# Patient Record
Sex: Female | Born: 1975 | Race: Asian | Hispanic: No | Marital: Married | Smoking: Never smoker
Health system: Southern US, Community
[De-identification: ages and names within clinical notes are randomized; demographics above are authoritative.]

## PROBLEM LIST (undated history)

## (undated) DIAGNOSIS — I10 Essential (primary) hypertension: Secondary | ICD-10-CM

## (undated) DIAGNOSIS — R519 Headache, unspecified: Secondary | ICD-10-CM

## (undated) DIAGNOSIS — Z973 Presence of spectacles and contact lenses: Secondary | ICD-10-CM

---

## 2006-02-13 ENCOUNTER — Ambulatory Visit: Payer: Self-pay | Admitting: Obstetrics & Gynecology

## 2006-02-19 ENCOUNTER — Ambulatory Visit: Payer: Self-pay | Admitting: *Deleted

## 2006-02-26 ENCOUNTER — Ambulatory Visit (HOSPITAL_COMMUNITY): Admission: RE | Admit: 2006-02-26 | Discharge: 2006-02-26 | Payer: Self-pay | Admitting: *Deleted

## 2006-03-05 ENCOUNTER — Ambulatory Visit (HOSPITAL_COMMUNITY): Admission: RE | Admit: 2006-03-05 | Discharge: 2006-03-05 | Payer: Self-pay | Admitting: Obstetrics and Gynecology

## 2006-05-14 ENCOUNTER — Ambulatory Visit (HOSPITAL_COMMUNITY): Admission: RE | Admit: 2006-05-14 | Discharge: 2006-05-14 | Payer: Self-pay | Admitting: Obstetrics & Gynecology

## 2006-08-02 ENCOUNTER — Ambulatory Visit: Payer: Self-pay | Admitting: Gynecology

## 2006-08-02 ENCOUNTER — Inpatient Hospital Stay (HOSPITAL_COMMUNITY): Admission: AD | Admit: 2006-08-02 | Discharge: 2006-08-05 | Payer: Self-pay | Admitting: *Deleted

## 2006-08-07 ENCOUNTER — Ambulatory Visit: Payer: Self-pay | Admitting: Gynecology

## 2006-08-21 ENCOUNTER — Ambulatory Visit: Admission: RE | Admit: 2006-08-21 | Discharge: 2006-08-21 | Payer: Self-pay | Admitting: Obstetrics & Gynecology

## 2007-08-22 ENCOUNTER — Emergency Department (HOSPITAL_COMMUNITY): Admission: EM | Admit: 2007-08-22 | Discharge: 2007-08-23 | Payer: Self-pay | Admitting: Emergency Medicine

## 2011-02-23 NOTE — Op Note (Signed)
Amy Montgomery, Amy Montgomery             ACCOUNT NO.:  [id number]   MEDICAL RECORD NO.:  [id number]          PATIENT TYPE:  INP   LOCATION:  9142                          FACILITY:  [facility]   PHYSICIAN:  Ginger Carne, MD  DATE OF BIRTH:  Jan 02, 1976   DATE OF PROCEDURE:  08/03/2006  DATE OF DISCHARGE:                                 OPERATIVE REPORT   PREOPERATIVE DIAGNOSES:  1. Fetal bradycardia nonreassuring fetal heart rate.  2. Failure to progress first stage of labor.   POSTOP DIAGNOSES:  1. Fetal bradycardia nonreassuring fetal heart rate.  2. Failure to progress first stage of labor.  3. Term viable delivery of female infant.   PROCEDURE:  Primary low transverse cesarean section.   SURGEON:  Ginger Carne, MD   ASSISTANT:  Dr. Imogene Burn.   ESTIMATED BLOOD LOSS:  600 mL.   COMPLICATIONS:  None immediate.   ANESTHESIA:  Epidural.   OPERATIVE FINDINGS:  Term infant female delivered in a vertex presentation.  Apgar, weight, and time--per delivery room record.  No gross abnormalities.  Baby cried spontaneously at delivery.  Nuchal cord x1.  Amniotic fluid was  clear.  Uterus, tubes, and ovaries showed normal decidual changes of  pregnancy.  The placenta was complete, a 3-vessel cord with a central  insertion.   OPERATIVE PROCEDURE:  The patient prepped and draped in the usual fashion;  and placed in the left lateral supine position.  Betadine solution used for  antiseptic; and the patient was catheterized prior to the procedure.  After  adequate epidural analgesia, a Pfannenstiel incision was made; and the  abdomen opened.  Lower uterine segment incised transversely.  Baby  delivered, cord clamped and cut, and infant given to the pediatric staff  after bulb suctioning.  Placenta removed manually.  Uterus inspected.  Closure of uterine musculature in one layer of #0 Vicryl  running interlocking suture.  Bleeding points hemostatically checked.  Blood  clots removed.  Closure of  the fascia in one layer of #0 Vicryl suture; and  skin staples for the skin.  Instrument and sponge counts were correct.  The  patient tolerated the procedure well; and returned to the post anesthesia  recovery room in excellent condition.      Ginger Carne, MD  Electronically Signed     SHB/MEDQ  D:  08/03/2006  T:  08/04/2006  Job:  [id number]

## 2011-02-23 NOTE — Discharge Summary (Signed)
Amy Montgomery, Amy Montgomery             ACCOUNT NO.:  [id number]   MEDICAL RECORD NO.:  [id number]          PATIENT TYPE:  INP   LOCATION:  9142                          FACILITY:  [facility]   PHYSICIAN:  Lesly Dukes, M.D. DATE OF BIRTH:  [birth year]   DATE OF ADMISSION:  08/02/2006  DATE OF DISCHARGE:  08/05/2006                                 DISCHARGE SUMMARY   ADMISSION DIAGNOSES:  52. A 35 year old G1, P0 at 40 weeks.  2. Spontaneous rupture of membranes at 8:00 a.m. on October 26.   DISCHARGE DIAGNOSES:  1. Post partum day #3 from an low transverse cesarean section for      nonreassuring fetal heart tones.  2. Preeclampsia, treated with magnesium.  3. Live female child, delivered.  4. Spontaneous rupture of membranes.   HOSPITAL COURSE:  Patient was admitted, a 35 year old G1, P0 at 40 week.  She had spontaneous rupture of membranes on October 26 at about 8:00 a.m.  At this point, she was checked and found to be pooling positive, Nitrazine  positive, ferning positive, and her cervical exam was 470, -2.  It is  presumed that she is likely in active labor.  Patient did have some  increased blood pressures in triage, the highest being 155/112, but then  decreased to 132/98.  PIH labs and UA for protein were done.  UA showed 30  mg/dl protein as well as a high specific gravity of greater than 1.030.  Creatinine was 0.7.  ALP was 118, hemoglobin 13.4.  LDH was normal, but uric  acid was a little increased at 6.3.  The patient did not have any question  pain, headaches, or vision changes.  She was admitted to L&D for expectant  management.  She was started on Pitocin, low dose.  Due to her PIH labs, she  was placed on magnesium.  Her blood pressures were in the 120-130s during  her labor.  She was also given Stadol for pain p.r.n.  The patient was  settled for a C-section, primary LTCS, secondary to fetal bradycardia,  nonreassuring fetal heart tones, and failure to progress in the first  stage  of labor.  The patient delivered a female infant, Apgars 7 at one minute, 8 at  five minutes.  Please see delivery dictation for more information.  EBL was  600 cc.   The patient's post partum course was unremarkable.  She is Rubella nonimmune  and will be given a new Rubella vaccine prior to discharge.  She had a female  infant.  A circumcision was completed while in the hospital.  She is breast-  feeding.  She will be on Micronor for contraception.  She is to return to  the clinic and the hospital tomorrow or the next day for staple removal.  She was passing flatus and having only a mild amount of pain when she was  discharged.   The patient was continued on magnesium 24 hours after her delivery, but at  this time, her PIH labs were normalizing, and she did not have increased  blood pressure.   LABS:  Prenatal labs  include, patient was B+, antibody screen negative,  hemoglobin 13.8, platelets 273.  Rubella nonimmune.  Hepatitis B surface  antigen negative.  Syphilis nonreactive.  HIV nonreactive.  Gonorrhea and  Chlamydia negative.  GBS negative on July 08, 2006.  Primary GCP 112.   Patient's labs during hospitalization, her discharge CBC on October 27th is  white blood cells 18.1, hemoglobin 11.7, and hematocrit 33, platelets 184.  RPR was nonreactive.  Her last set of PIH labs, uric acid was 6, LDH 141.  CMP was within normal limits.   INSTRUCTIONS:  Patient is to be on pelvic rest x6 weeks.  She is to start  her Micronor about two weeks after she is discharged and is to take it at  the same time every day for birth control while she is breast-feeding.  She  is to call and make an appointment in six weeks for her normal post partum  appointment.  She is to come into the clinic tomorrow or the next day to get  her staples removed.  She is to call and make and appointment.  She was  given the number.   Discharge medications include ibuprofen, Percocet, Micronor, Colace,   prenatal vitamins.   DISCHARGE CONDITION:  Stable.     ______________________________  Alanda Amass, M.D.    ______________________________  Lesly Dukes, M.D.    JH/MEDQ  D:  08/05/2006  T:  08/05/2006  Job:  [id number]

## 2011-07-17 LAB — DIFFERENTIAL
Basophils Absolute: 0
Basophils Absolute: 0
Eosinophils Absolute: 0 — ABNORMAL LOW
Eosinophils Relative: 0
Lymphocytes Relative: 3 — ABNORMAL LOW
Monocytes Absolute: 0.4
Monocytes Absolute: 0.3
Neutro Abs: 12.4 — ABNORMAL HIGH

## 2011-07-17 LAB — URINALYSIS, ROUTINE W REFLEX MICROSCOPIC
Hgb urine dipstick: NEGATIVE
Ketones, ur: 15 — AB
Leukocytes, UA: NEGATIVE
Nitrite: NEGATIVE
Protein, ur: 30 — AB
Specific Gravity, Urine: 1.03
Urobilinogen, UA: 1

## 2011-07-17 LAB — CBC
HCT: 37.7
HCT: 36.8
Hemoglobin: 12.8
MCHC: 33.7
MCHC: 34.8
MCV: 86.8
Platelets: 248
RBC: 4.23
WBC: 13.4 — ABNORMAL HIGH

## 2011-07-17 LAB — I-STAT 8, (EC8 V) (CONVERTED LAB)
Operator id: 161631
Potassium: 3.2 — ABNORMAL LOW
pCO2, Ven: 29.4 — ABNORMAL LOW
pH, Ven: 7.494 — ABNORMAL HIGH

## 2011-07-17 LAB — URINE MICROSCOPIC-ADD ON

## 2011-07-17 LAB — POCT PREGNANCY, URINE
Operator id: 161631
Preg Test, Ur: NEGATIVE

## 2011-07-17 LAB — WET PREP, GENITAL
Trich, Wet Prep: NONE SEEN
Yeast Wet Prep HPF POC: NONE SEEN

## 2011-07-17 LAB — GC/CHLAMYDIA PROBE AMP, GENITAL
Chlamydia, DNA Probe: NEGATIVE
GC Probe Amp, Genital: NEGATIVE

## 2011-07-17 LAB — RPR: RPR Ser Ql: NONREACTIVE

## 2011-10-09 DIAGNOSIS — B977 Papillomavirus as the cause of diseases classified elsewhere: Secondary | ICD-10-CM

## 2011-10-09 HISTORY — DX: Papillomavirus as the cause of diseases classified elsewhere: B97.7

## 2011-12-26 ENCOUNTER — Ambulatory Visit
Admission: RE | Admit: 2011-12-26 | Discharge: 2011-12-26 | Disposition: A | Discharge status: Home / Self Care | Payer: 59 | Source: Ambulatory Visit | Attending: Family Medicine | Admitting: Family Medicine

## 2011-12-26 ENCOUNTER — Other Ambulatory Visit: Payer: Self-pay | Admitting: Family Medicine

## 2011-12-26 DIAGNOSIS — T1490XA Injury, unspecified, initial encounter: Secondary | ICD-10-CM

## 2012-01-24 ENCOUNTER — Other Ambulatory Visit (HOSPITAL_COMMUNITY)
Admission: RE | Admit: 2012-01-24 | Discharge: 2012-01-24 | Disposition: A | Discharge status: Home / Self Care | Payer: 59 | Source: Ambulatory Visit | Attending: Family Medicine | Admitting: Family Medicine

## 2012-01-24 ENCOUNTER — Other Ambulatory Visit: Payer: Self-pay | Admitting: Family Medicine

## 2012-01-24 DIAGNOSIS — R87810 Cervical high risk human papillomavirus (HPV) DNA test positive: Secondary | ICD-10-CM | POA: Insufficient documentation

## 2012-01-24 DIAGNOSIS — Z124 Encounter for screening for malignant neoplasm of cervix: Secondary | ICD-10-CM | POA: Insufficient documentation

## 2012-02-27 ENCOUNTER — Other Ambulatory Visit: Payer: Self-pay | Admitting: Obstetrics and Gynecology

## 2012-09-03 ENCOUNTER — Other Ambulatory Visit: Payer: Self-pay | Admitting: Family Medicine

## 2012-09-03 ENCOUNTER — Other Ambulatory Visit (HOSPITAL_COMMUNITY)
Admission: RE | Admit: 2012-09-03 | Discharge: 2012-09-03 | Disposition: A | Discharge status: Home / Self Care | Payer: 59 | Source: Ambulatory Visit | Attending: Family Medicine | Admitting: Family Medicine

## 2012-09-03 DIAGNOSIS — Z01419 Encounter for gynecological examination (general) (routine) without abnormal findings: Secondary | ICD-10-CM | POA: Insufficient documentation

## 2012-09-03 DIAGNOSIS — R87810 Cervical high risk human papillomavirus (HPV) DNA test positive: Secondary | ICD-10-CM | POA: Insufficient documentation

## 2012-09-03 DIAGNOSIS — Z1151 Encounter for screening for human papillomavirus (HPV): Secondary | ICD-10-CM | POA: Insufficient documentation

## 2013-03-06 ENCOUNTER — Other Ambulatory Visit (HOSPITAL_COMMUNITY)
Admission: RE | Admit: 2013-03-06 | Discharge: 2013-03-06 | Disposition: A | Discharge status: Home / Self Care | Payer: 59 | Source: Ambulatory Visit | Attending: Obstetrics and Gynecology | Admitting: Obstetrics and Gynecology

## 2013-03-06 ENCOUNTER — Other Ambulatory Visit: Payer: Self-pay | Admitting: Obstetrics and Gynecology

## 2013-03-06 DIAGNOSIS — R87810 Cervical high risk human papillomavirus (HPV) DNA test positive: Secondary | ICD-10-CM | POA: Insufficient documentation

## 2013-03-06 DIAGNOSIS — Z1151 Encounter for screening for human papillomavirus (HPV): Secondary | ICD-10-CM | POA: Insufficient documentation

## 2013-03-06 DIAGNOSIS — Z01419 Encounter for gynecological examination (general) (routine) without abnormal findings: Secondary | ICD-10-CM | POA: Insufficient documentation

## 2013-04-08 ENCOUNTER — Other Ambulatory Visit: Payer: Self-pay | Admitting: Obstetrics and Gynecology

## 2019-10-09 DIAGNOSIS — U071 COVID-19: Secondary | ICD-10-CM

## 2019-10-09 HISTORY — DX: COVID-19: U07.1

## 2021-10-04 ENCOUNTER — Other Ambulatory Visit: Payer: Self-pay | Admitting: Internal Medicine

## 2021-10-04 DIAGNOSIS — Z1231 Encounter for screening mammogram for malignant neoplasm of breast: Secondary | ICD-10-CM

## 2021-10-08 DIAGNOSIS — D069 Carcinoma in situ of cervix, unspecified: Secondary | ICD-10-CM

## 2021-10-08 HISTORY — DX: Carcinoma in situ of cervix, unspecified: D06.9

## 2021-10-19 ENCOUNTER — Other Ambulatory Visit: Payer: Self-pay | Admitting: Obstetrics and Gynecology

## 2021-11-02 ENCOUNTER — Ambulatory Visit: Payer: Self-pay

## 2021-11-08 ENCOUNTER — Ambulatory Visit
Admission: RE | Admit: 2021-11-08 | Discharge: 2021-11-08 | Disposition: A | Discharge status: Home / Self Care | Payer: 59 | Source: Ambulatory Visit | Attending: Internal Medicine | Admitting: Internal Medicine

## 2021-11-08 DIAGNOSIS — Z1231 Encounter for screening mammogram for malignant neoplasm of breast: Secondary | ICD-10-CM

## 2022-01-19 HISTORY — PX: COLONOSCOPY: SHX174

## 2022-05-15 ENCOUNTER — Other Ambulatory Visit: Payer: Self-pay

## 2022-05-15 ENCOUNTER — Encounter (HOSPITAL_BASED_OUTPATIENT_CLINIC_OR_DEPARTMENT_OTHER): Payer: Self-pay | Admitting: Obstetrics and Gynecology

## 2022-05-15 NOTE — Progress Notes (Addendum)
Spoke w/ via phone for pre-op interview---Amy Montgomery needs dos---- ISTAT, EKG, urine pregnancy per anesthesia, surgeon orders pending as of 05/15/22              Montgomery results------none COVID test -----patient states asymptomatic no test needed Arrive at -------0530 on Tuesday, 05/22/22 NPO after MN NO Solid Food.  Clear liquids from MN until---0430 Med rec completed Medications to take morning of surgery -----Amlodipine Diabetic medication -----n/a Patient instructed no nail polish to be worn day of surgery Patient instructed to bring photo id and insurance card day of surgery Patient aware to have Driver (ride ) / caregiver    for 24 hours after surgery - husband, Amy Montgomery Patient Special Instructions -----Patient is going to wear glasses instead on contacts on day of surgery. Pre-Op special Istructions -----Requested orders from Dr. Richardson Dopp on 05/15/22 via Epic IB. Patient verbalized understanding of instructions that were given at this phone interview. Patient denies shortness of breath, chest pain, fever, cough at this phone interview.

## 2022-05-21 ENCOUNTER — Other Ambulatory Visit: Payer: Self-pay | Admitting: Obstetrics and Gynecology

## 2022-05-21 DIAGNOSIS — D069 Carcinoma in situ of cervix, unspecified: Secondary | ICD-10-CM

## 2022-05-21 NOTE — Anesthesia Preprocedure Evaluation (Signed)
Anesthesia Evaluation  Patient identified by MRN, date of birth, ID band Patient awake    Reviewed: Allergy & Precautions, NPO status , Patient's Chart, lab work & pertinent test results  Airway Mallampati: II  TM Distance: >3 FB Neck ROM: Full    Dental no notable dental hx. (+) Teeth Intact, Dental Advisory Given   Pulmonary    Pulmonary exam normal breath sounds clear to auscultation       Cardiovascular hypertension, Pt. on medications Normal cardiovascular exam Rhythm:Regular Rate:Normal     Neuro/Psych  Headaches,    GI/Hepatic negative GI ROS, Neg liver ROS,   Endo/Other  negative endocrine ROS  Renal/GU negative Renal ROS     Musculoskeletal negative musculoskeletal ROS (+)   Abdominal (+) + obese (BMI 39.3),   Peds  Hematology Lab Results           HGB                      15.3 (H)            05/22/2022                HCT                      45.0                05/22/2022                   Anesthesia Other Findings NKA  Reproductive/Obstetrics                           Anesthesia Physical Anesthesia Plan  ASA: 3  Anesthesia Plan: General   Post-op Pain Management: Toradol IV (intra-op)*, Dilaudid IV and Tylenol PO (pre-op)*   Induction: Intravenous  PONV Risk Score and Plan: 4 or greater and Treatment may vary due to age or medical condition, Ondansetron, Midazolam and Dexamethasone  Airway Management Planned: LMA  Additional Equipment: None  Intra-op Plan:   Post-operative Plan:   Informed Consent: I have reviewed the patients History and Physical, chart, labs and discussed the procedure including the risks, benefits and alternatives for the proposed anesthesia with the patient or authorized representative who has indicated his/her understanding and acceptance.     Dental advisory given  Plan Discussed with:   Anesthesia Plan Comments:        Anesthesia  Quick Evaluation

## 2022-05-21 NOTE — H&P (Deleted)
  The note originally documented on this encounter has been moved the the encounter in which it belongs.  

## 2022-05-21 NOTE — H&P (Signed)
Reason for Appointment   1. PreOp for 05/22/22       History of Present Illness  General:          46 y/o presents for preop visit. Pt is schedule for a Cold knife conization on 05/22/2022 for the management of CIN III.        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        Pap smear 09/28/2021 was LGSIL with high risk hpv detected.     Current Medications  Taking  Advil(Ibuprofen) 200 MG Tablet 1 tablet Orally every 6 hrs    amLODIPine Besylate 10 MG Tablet TAKE ONE TABLET BY MOUTH DAILY     Multivitamin     Medication List reviewed and reconciled with the patient         Past Medical History        Hypertension.         Obesity.         Hypercholesterolemia.        Surgical History         Csection 2007         colposcopy 2007       Family History   Father: alive, sleep apnea, high cholesterol, diagnosed with Hypertension, Coronary artery disease   Mother: deceased, Hypercholesterolemia, diagnosed with Hypertension, CVA   Paternal Grand Father: deceased, stroke, heart attack   Maternal Grand Mother: deceased, diagnosed with Diabetes, Breast cancer   Brother 1: deceased, diagnosed with Hypertension, Coronary artery disease   Brother2: alive   Maternal aunt: Breast CA   2 brother(s) .    denies any GYN family cancer hx.       Social History  General:   Tobacco use  cigarettes: Never smoked, Tobacco history last updated 05/01/2022. no EXPOSURE TO PASSIVE SMOKE. Alcohol: yes, Rare. Caffeine: yes, occasionally, coffee, tea. no Recreational drug use. DIET: regular. Exercise: yes, dancer. Marital Status: married. Children: 1 boy. OCCUPATION: employed, [employer] - costuming.  first cousin diagnosed with stage 4 colon cancer at the age of 37.     Gyn History  Sexual activity not currently sexually active.   Periods : regular.   LMP 04/14/2022.   Denies H/O Birth  control.   Last pap smear date 09/28/2021- LSIL/HPV+.   Denies H/O Last mammogram date.   Abnormal pap smear HPV, , assessed with colposcopy 02/27/12, 03/06/13 CIN-1 HPV,, LGSIL, 09/28/2021- LSIL/HPV+ assessed with colposcopy 10/19/2021.   STD HPV.   Menarche 15.   GYN procedures Colposcopy [facility] in [place].       OB History  Number of pregnancies 1.   Pregnancy # 1 07/2006 , live birth, C-section delivery.       Allergies   N.K.D.A.       Hospitalization/Major Diagnostic Procedure   child birth        Review of Systems  CONSTITUTIONAL:         Chills No. Fatigue No. Fever No. Night sweats No. Recent travel outside [place] No. Sweats No. Weight change No.     OPHTHALMOLOGY:         Blurring of vision no. Change in vision no. Double vision no.     ENT:         Dizziness no. Nose bleeds no. Sore throat no. Teeth pain no.     ALLERGY:         Hives no.  CARDIOLOGY:         Chest pain no. High blood pressure no. Irregular heart beat no. Leg edema no. Palpitations no.     RESPIRATORY:         Shortness of breath no. Cough no. Wheezing no.     UROLOGY:         Pain with urination no. Urinary urgency no. Urinary frequency no. Urinary incontinence no. Difficulty urinating No. Blood in urine No.     GASTROENTEROLOGY:         Abdominal pain no. Appetite change no. Bloating/belching no. Blood in stool or on toilet paper no. Change in bowel movements no. Constipation no. Diarrhea no. Difficulty swallowing no. Nausea no.     FEMALE REPRODUCTIVE:         Vulvar pain no. Vulvar rash no. Abnormal vaginal bleeding no. Breast pain no. Nipple discharge no. Pain with intercourse no. Pelvic pain no. Unusual vaginal discharge no. Vaginal itching no.     MUSCULOSKELETAL:         Muscle aches no.     NEUROLOGY:         Headache no. Tingling/numbness no. Weakness no.     PSYCHOLOGY:         Depression no. Anxiety no. Nervousness no. Sleep disturbances no. Suicidal ideation no .      ENDOCRINOLOGY:         Excessive thirst no. Excessive urination no. Hair loss no. Heat or cold intolerance no.     HEMATOLOGY/LYMPH:         Abnormal bleeding no. Easy bruising no. Swollen glands no.     DERMATOLOGY:         New/changing skin lesion no. Rash no. Sores no.          Vital Signs  Wt 222.4, Wt change 11.8 lbs, Ht 63, BMI 39.39, Pulse sitting 78, BP sitting 137/89.     Examination  General Examination:       CONSTITUTIONAL: alert, oriented, NAD . SKIN:  moist, warm. EYES:  Conjunctiva clear. LUNGS:  good I:E efffort noted, clear to auscultation bilaterally. HEART:  regular rate and rhythm. ABDOMEN: soft, non-tender/non-distended, bowel sounds present . FEMALE GENITOURINARY: normal external genitalia, labia - unremarkable, vagina - pink moist mucosa, no lesions or abnormal discharge, cervix - no discharge or lesions or CMT, adnexa - no masses or tenderness, uterus - nontender and normal size on palpation . PSYCH:  affect normal, good eye contact.       Physical Examination  Chaperone present:         Chaperone present  Lucie Leather 05/01/2022 04:50:48 PM > , for pelvic exam.            Assessments     1. CIN III (cervical intraepithelial neoplasia grade III) with severe dysplasia - D06.9 (Primary)     Treatment   1. CIN III (cervical intraepithelial neoplasia grade III) with severe dysplasia   Notes: recommend cold knife conization. r/b/a of surgery were discussed including but not limited to infection bleeding damage to surrounding organs with the need for further surgery. pt voiced understanding and desires to proceed. she is advised to avoid eating or drinking after midnight prior to surgery. She is also advised to avoid taking NSAIDs starting 14 days prior to surgery.

## 2022-05-22 ENCOUNTER — Ambulatory Visit (HOSPITAL_BASED_OUTPATIENT_CLINIC_OR_DEPARTMENT_OTHER): Payer: 59 | Admitting: Anesthesiology

## 2022-05-22 ENCOUNTER — Other Ambulatory Visit: Payer: Self-pay

## 2022-05-22 ENCOUNTER — Encounter (HOSPITAL_BASED_OUTPATIENT_CLINIC_OR_DEPARTMENT_OTHER): Payer: Self-pay | Admitting: Obstetrics and Gynecology

## 2022-05-22 ENCOUNTER — Ambulatory Visit (HOSPITAL_BASED_OUTPATIENT_CLINIC_OR_DEPARTMENT_OTHER)
Admission: RE | Admit: 2022-05-22 | Discharge: 2022-05-22 | Disposition: A | Discharge status: Home / Self Care | Payer: 59 | Source: Ambulatory Visit | Attending: Obstetrics and Gynecology | Admitting: Obstetrics and Gynecology

## 2022-05-22 ENCOUNTER — Encounter (HOSPITAL_BASED_OUTPATIENT_CLINIC_OR_DEPARTMENT_OTHER)
Admission: RE | Disposition: A | Discharge status: Home / Self Care | Payer: Self-pay | Source: Ambulatory Visit | Attending: Obstetrics and Gynecology

## 2022-05-22 DIAGNOSIS — Z6838 Body mass index (BMI) 38.0-38.9, adult: Secondary | ICD-10-CM

## 2022-05-22 DIAGNOSIS — E669 Obesity, unspecified: Secondary | ICD-10-CM

## 2022-05-22 DIAGNOSIS — I10 Essential (primary) hypertension: Secondary | ICD-10-CM | POA: Insufficient documentation

## 2022-05-22 DIAGNOSIS — Z6839 Body mass index (BMI) 39.0-39.9, adult: Secondary | ICD-10-CM | POA: Insufficient documentation

## 2022-05-22 DIAGNOSIS — D069 Carcinoma in situ of cervix, unspecified: Secondary | ICD-10-CM | POA: Diagnosis not present

## 2022-05-22 DIAGNOSIS — Z01818 Encounter for other preprocedural examination: Secondary | ICD-10-CM

## 2022-05-22 HISTORY — DX: Headache, unspecified: R51.9

## 2022-05-22 HISTORY — DX: Presence of spectacles and contact lenses: Z97.3

## 2022-05-22 HISTORY — PX: CERVICAL CONIZATION W/BX: SHX1330

## 2022-05-22 HISTORY — DX: Essential (primary) hypertension: I10

## 2022-05-22 LAB — POCT I-STAT, CHEM 8
BUN: 11 mg/dL (ref 6–20)
Calcium, Ion: 1.13 mmol/L — ABNORMAL LOW (ref 1.15–1.40)
Chloride: 102 mmol/L (ref 98–111)
Creatinine, Ser: 0.7 mg/dL (ref 0.44–1.00)
Glucose, Bld: 102 mg/dL — ABNORMAL HIGH (ref 70–99)
HCT: 45 % (ref 36.0–46.0)
Hemoglobin: 15.3 g/dL — ABNORMAL HIGH (ref 12.0–15.0)
Potassium: 3.3 mmol/L — ABNORMAL LOW (ref 3.5–5.1)
Sodium: 140 mmol/L (ref 135–145)
TCO2: 24 mmol/L (ref 22–32)

## 2022-05-22 LAB — POCT PREGNANCY, URINE: Preg Test, Ur: NEGATIVE

## 2022-05-22 SURGERY — CONE BIOPSY, CERVIX
Anesthesia: General | Site: Cervix

## 2022-05-22 MED ORDER — DEXAMETHASONE SODIUM PHOSPHATE 10 MG/ML IJ SOLN
INTRAMUSCULAR | Status: AC
  Filled 2022-05-22: qty 1

## 2022-05-22 MED ORDER — KETOROLAC TROMETHAMINE 30 MG/ML IJ SOLN: 30.0000 mg | Freq: Once | INTRAMUSCULAR | Status: DC | PRN

## 2022-05-22 MED ORDER — LIDOCAINE-EPINEPHRINE 1 %-1:100000 IJ SOLN
INTRAMUSCULAR | Status: DC | PRN
  Administered 2022-05-22: 20 mL

## 2022-05-22 MED ORDER — KETOROLAC TROMETHAMINE 30 MG/ML IJ SOLN
INTRAMUSCULAR | Status: AC
  Filled 2022-05-22: qty 1

## 2022-05-22 MED ORDER — IBUPROFEN 800 MG PO TABS: 800.0000 mg | ORAL_TABLET | Freq: Three times a day (TID) | ORAL | 0 refills | Status: AC | PRN

## 2022-05-22 MED ORDER — MIDAZOLAM HCL 2 MG/2ML IJ SOLN
INTRAMUSCULAR | Status: AC
  Filled 2022-05-22: qty 2

## 2022-05-22 MED ORDER — ACETAMINOPHEN 500 MG PO TABS
1000.0000 mg | ORAL_TABLET | ORAL | Status: AC
  Administered 2022-05-22: 1000 mg via ORAL

## 2022-05-22 MED ORDER — PROPOFOL 500 MG/50ML IV EMUL
INTRAVENOUS | Status: AC
  Filled 2022-05-22: qty 50

## 2022-05-22 MED ORDER — LACTATED RINGERS IV SOLN: INTRAVENOUS | Status: DC

## 2022-05-22 MED ORDER — DEXAMETHASONE SODIUM PHOSPHATE 10 MG/ML IJ SOLN
INTRAMUSCULAR | Status: DC | PRN
  Administered 2022-05-22: 10 mg via INTRAVENOUS

## 2022-05-22 MED ORDER — ACETAMINOPHEN 500 MG PO TABS
ORAL_TABLET | ORAL | Status: AC
  Filled 2022-05-22: qty 2

## 2022-05-22 MED ORDER — OXYCODONE HCL 5 MG/5ML PO SOLN: 5.0000 mg | Freq: Once | ORAL | Status: DC | PRN

## 2022-05-22 MED ORDER — 0.9 % SODIUM CHLORIDE (POUR BTL) OPTIME
TOPICAL | Status: DC | PRN
  Administered 2022-05-22: 500 mL

## 2022-05-22 MED ORDER — GELATIN ABSORBABLE 12-7 MM EX MISC: CUTANEOUS | Status: DC | PRN

## 2022-05-22 MED ORDER — FENTANYL CITRATE (PF) 100 MCG/2ML IJ SOLN
INTRAMUSCULAR | Status: AC
  Filled 2022-05-22: qty 2

## 2022-05-22 MED ORDER — HYDROMORPHONE HCL 1 MG/ML IJ SOLN: 0.2500 mg | INTRAMUSCULAR | Status: DC | PRN

## 2022-05-22 MED ORDER — POVIDONE-IODINE 10 % EX SWAB: 2.0000 | Freq: Once | CUTANEOUS | Status: DC

## 2022-05-22 MED ORDER — ONDANSETRON HCL 4 MG/2ML IJ SOLN
INTRAMUSCULAR | Status: AC
  Filled 2022-05-22: qty 2

## 2022-05-22 MED ORDER — OXYCODONE HCL 5 MG PO TABS: 5.0000 mg | ORAL_TABLET | Freq: Once | ORAL | Status: DC | PRN

## 2022-05-22 MED ORDER — OXYCODONE HCL 5 MG PO TABS: 5.0000 mg | ORAL_TABLET | ORAL | 0 refills | Status: AC | PRN

## 2022-05-22 MED ORDER — PROPOFOL 10 MG/ML IV BOLUS
INTRAVENOUS | Status: DC | PRN
  Administered 2022-05-22: 200 mg via INTRAVENOUS

## 2022-05-22 MED ORDER — LIDOCAINE 2% (20 MG/ML) 5 ML SYRINGE
INTRAMUSCULAR | Status: DC | PRN
  Administered 2022-05-22: 100 mg via INTRAVENOUS

## 2022-05-22 MED ORDER — HEMOSTATIC AGENTS (NO CHARGE) OPTIME
TOPICAL | Status: DC | PRN
  Administered 2022-05-22: 1 via TOPICAL

## 2022-05-22 MED ORDER — FENTANYL CITRATE (PF) 100 MCG/2ML IJ SOLN
INTRAMUSCULAR | Status: DC | PRN
  Administered 2022-05-22: 50 ug via INTRAVENOUS

## 2022-05-22 MED ORDER — ONDANSETRON HCL 4 MG/2ML IJ SOLN
INTRAMUSCULAR | Status: DC | PRN
  Administered 2022-05-22: 4 mg via INTRAVENOUS

## 2022-05-22 MED ORDER — FERRIC SUBSULFATE (BULK) SOLN
Status: DC | PRN
  Administered 2022-05-22: 1

## 2022-05-22 MED ORDER — MIDAZOLAM HCL 5 MG/5ML IJ SOLN
INTRAMUSCULAR | Status: DC | PRN
  Administered 2022-05-22: 2 mg via INTRAVENOUS

## 2022-05-22 MED ORDER — ONDANSETRON HCL 4 MG/2ML IJ SOLN: 4.0000 mg | Freq: Once | INTRAMUSCULAR | Status: DC | PRN

## 2022-05-22 MED ORDER — IODINE STRONG (LUGOLS) 5 % PO SOLN
ORAL | Status: DC | PRN
  Administered 2022-05-22: 0.1 mL

## 2022-05-22 SURGICAL SUPPLY — 29 items
BLADE SURG 11 STRL SS (BLADE) ×2 IMPLANT
CATH ROBINSON RED A/P 16FR (CATHETERS) ×2 IMPLANT
DRSG TELFA 3X8 NADH (GAUZE/BANDAGES/DRESSINGS) ×2 IMPLANT
ELECT BALL LEEP 5MM RED (ELECTRODE) ×3 IMPLANT
ELECT REM PT RETURN 9FT ADLT (ELECTROSURGICAL) ×2
ELECTRODE REM PT RTRN 9FT ADLT (ELECTROSURGICAL) ×1 IMPLANT
GAUZE 4X4 16PLY ~~LOC~~+RFID DBL (SPONGE) ×6 IMPLANT
GLOVE BIOGEL M 6.5 STRL (GLOVE) ×2 IMPLANT
GLOVE BIOGEL PI IND STRL 6.5 (GLOVE) ×2 IMPLANT
GLOVE BIOGEL PI IND STRL 7.0 (GLOVE) ×1 IMPLANT
GLOVE BIOGEL PI INDICATOR 6.5 (GLOVE) ×2
GLOVE BIOGEL PI INDICATOR 7.0 (GLOVE) ×1
GOWN STRL REUS W/TWL LRG LVL3 (GOWN DISPOSABLE) ×4 IMPLANT
HEMOSTAT SURGICEL 4X8 (HEMOSTASIS) ×1 IMPLANT
KIT TURNOVER CYSTO (KITS) ×2 IMPLANT
NS IRRIG 1000ML POUR BTL (IV SOLUTION) ×2 IMPLANT
PACK VAGINAL MINOR WOMEN LF (CUSTOM PROCEDURE TRAY) ×2 IMPLANT
PACK VAGINAL WOMENS (CUSTOM PROCEDURE TRAY) ×2 IMPLANT
PAD DRESSING TELFA 3X8 NADH (GAUZE/BANDAGES/DRESSINGS) ×1 IMPLANT
PAD OB MATERNITY 4.3X12.25 (PERSONAL CARE ITEMS) ×2 IMPLANT
PENCIL SMOKE EVACUATOR (MISCELLANEOUS) IMPLANT
SCOPETTES 8  STERILE (MISCELLANEOUS) ×2
SCOPETTES 8 STERILE (MISCELLANEOUS) ×1 IMPLANT
SLEEVE SCD COMPRESS KNEE MED (STOCKING) ×2 IMPLANT
SPONGE SURGIFOAM ABS GEL 12-7 (HEMOSTASIS) ×1 IMPLANT
SUT VIC AB 0 CT1 27 (SUTURE) ×10
SUT VIC AB 0 CT1 27XBRD ANBCTR (SUTURE) ×3 IMPLANT
TOWEL OR 17X26 10 PK STRL BLUE (TOWEL DISPOSABLE) ×4 IMPLANT
TUBE CONNECTING 12X1/4 (SUCTIONS) ×4 IMPLANT

## 2022-05-22 NOTE — Transfer of Care (Signed)
Immediate Anesthesia Transfer of Care Note  Patient: Amy Montgomery  Procedure(s) Performed: CONIZATION CERVIX WITH BIOPSY (Cervix)  Patient Location: PACU  Anesthesia Type:General  Level of Consciousness: drowsy  Airway & Oxygen Therapy: Patient Spontanous Breathing and Patient connected to nasal cannula oxygen  Post-op Assessment: Report given to RN  Post vital signs: Reviewed and stable  Last Vitals:  Vitals Value Taken Time  BP 105/67   Temp    Pulse 78 05/22/22 0826  Resp    SpO2 97 % 05/22/22 0826  Vitals shown include unvalidated device data.  Last Pain:  Vitals:   05/22/22 0656  TempSrc: Oral         Complications: No notable events documented.

## 2022-05-22 NOTE — Discharge Instructions (Signed)

## 2022-05-22 NOTE — Anesthesia Postprocedure Evaluation (Signed)
Anesthesia Post Note  Patient: Velna Hatchet  Procedure(s) Performed: CONIZATION CERVIX WITH BIOPSY (Cervix)     Patient location during evaluation: PACU Anesthesia Type: General Level of consciousness: awake and alert Pain management: pain level controlled Vital Signs Assessment: post-procedure vital signs reviewed and stable Respiratory status: spontaneous breathing, nonlabored ventilation, respiratory function stable and patient connected to nasal cannula oxygen Cardiovascular status: blood pressure returned to baseline and stable Postop Assessment: no apparent nausea or vomiting Anesthetic complications: no   No notable events documented.  Last Vitals:  Vitals:   05/22/22 0915 05/22/22 0950  BP: 118/80 131/87  Pulse: 71 70  Resp: 19 19  Temp:    SpO2: 98% 99%    Last Pain:  Vitals:   05/22/22 0950  TempSrc:   PainSc: 0-No pain                 Trevor Iha

## 2022-05-22 NOTE — Op Note (Signed)
05/22/2022  8:24 AM  PATIENT:  Amy Montgomery  46 y.o. female  PRE-OPERATIVE DIAGNOSIS:  D06.9 CIN III  POST-OPERATIVE DIAGNOSIS:  D06.9 CIN III  PROCEDURE:  Procedure(s): CONIZATION CERVIX WITH BIOPSY (N/A)  SURGEON:  Surgeon(s) and Role:    Gerald Leitz, MD - Primary  PHYSICIAN ASSISTANT:   ASSISTANTS: none   ANESTHESIA:   general  EBL:  10 mL   BLOOD ADMINISTERED:none  DRAINS: none   LOCAL MEDICATIONS USED:  LIDOCAINE   SPECIMEN:  Source of Specimen:  cervical cone  DISPOSITION OF SPECIMEN:  PATHOLOGY  COUNTS:  YES  TOURNIQUET:  * No tourniquets in log *  DICTATION: .Note written in EPIC  PLAN OF CARE: Discharge to home after PACU  PATIENT DISPOSITION:  PACU - hemodynamically stable.   Delay start of Pharmacological VTE agent (>24hrs) due to surgical blood loss or risk of bleeding: not applicable  Findings: normal appearing external genitalia and vaginal mucosa.. The cervix displayed acetowhite area at 12 and 6 oclock with application of lugols.   Procedure. Pt was taken to the operating room where she was placed under general anesthesia. Time out was performed. She was prepped and draped in a sterile fashion. Speculum was placed. The anterior lip of the cervix was grasped with single toothed tenaculum.  10 cc of 1% lidocaine with epi was placed at the 4 and 8 o'clock positions. A suture of 0 vicryl was placed at the 3 and 9 o'clock positions and used for retraction. The single toothed tenaculum was removed. Lugol's  Was placed on the cervix with the findings noted above. Scalpel was used to excise a cone portion of the cervix. The specimen was marked with a suture at the 12 o'clock position and sent to pathology. Hemostasis was obtained with roller ball and Monsel. surgicel was placed in the wound bed.  Excellent hemostasis was noted.  Sponge lap and needle counts were correct x 2. Pt was awakened from anesthesia and taken to the recovery room in stable condition.

## 2022-05-22 NOTE — H&P (Signed)
Date of Initial H&P: 05/22/2022  History reviewed, patient examined, no change in status, stable for surgery.

## 2022-05-22 NOTE — Anesthesia Procedure Notes (Signed)
Procedure Name: LMA Insertion Date/Time: 05/22/2022 7:40 AM  Performed by: Briant Sites, CRNAPre-anesthesia Checklist: Patient identified, Emergency Drugs available, Suction available and Patient being monitored Patient Re-evaluated:Patient Re-evaluated prior to induction Oxygen Delivery Method: Circle system utilized Preoxygenation: Pre-oxygenation with 100% oxygen Induction Type: IV induction Ventilation: Mask ventilation without difficulty LMA: LMA inserted LMA Size: 4.0 Number of attempts: 1 Airway Equipment and Method: Bite block Placement Confirmation: positive ETCO2 Dental Injury: Teeth and Oropharynx as per pre-operative assessment

## 2022-05-23 ENCOUNTER — Encounter (HOSPITAL_BASED_OUTPATIENT_CLINIC_OR_DEPARTMENT_OTHER): Payer: Self-pay | Admitting: Obstetrics and Gynecology

## 2022-05-23 LAB — SURGICAL PATHOLOGY

## 2022-10-29 ENCOUNTER — Other Ambulatory Visit (HOSPITAL_COMMUNITY)
Admission: RE | Admit: 2022-10-29 | Discharge: 2022-10-29 | Disposition: A | Discharge status: Home / Self Care | Payer: 59 | Source: Ambulatory Visit | Attending: Obstetrics and Gynecology | Admitting: Obstetrics and Gynecology

## 2022-10-29 DIAGNOSIS — D069 Carcinoma in situ of cervix, unspecified: Secondary | ICD-10-CM | POA: Insufficient documentation

## 2022-10-29 DIAGNOSIS — Z01419 Encounter for gynecological examination (general) (routine) without abnormal findings: Secondary | ICD-10-CM | POA: Insufficient documentation

## 2022-11-01 LAB — CYTOLOGY - PAP
Comment: NEGATIVE
Diagnosis: NEGATIVE
High risk HPV: NEGATIVE

## 2023-03-27 ENCOUNTER — Ambulatory Visit
Admission: RE | Admit: 2023-03-27 | Discharge: 2023-03-27 | Disposition: A | Discharge status: Home / Self Care | Payer: 59 | Source: Ambulatory Visit | Attending: Internal Medicine | Admitting: Internal Medicine

## 2023-03-27 ENCOUNTER — Other Ambulatory Visit: Payer: Self-pay | Admitting: Internal Medicine

## 2023-03-27 DIAGNOSIS — Z0000 Encounter for general adult medical examination without abnormal findings: Secondary | ICD-10-CM

## 2023-04-30 ENCOUNTER — Other Ambulatory Visit: Payer: Self-pay | Admitting: Obstetrics and Gynecology

## 2023-04-30 ENCOUNTER — Other Ambulatory Visit (HOSPITAL_COMMUNITY)
Admission: RE | Admit: 2023-04-30 | Discharge: 2023-04-30 | Disposition: A | Discharge status: Home / Self Care | Payer: 59 | Source: Ambulatory Visit | Attending: Obstetrics and Gynecology | Admitting: Obstetrics and Gynecology

## 2023-04-30 DIAGNOSIS — D069 Carcinoma in situ of cervix, unspecified: Secondary | ICD-10-CM | POA: Diagnosis not present

## 2023-05-03 LAB — CYTOLOGY - PAP
Adequacy: ABSENT
Comment: NEGATIVE
Diagnosis: NEGATIVE
High risk HPV: NEGATIVE

## 2023-11-02 IMAGING — MG MM DIGITAL SCREENING BILAT W/ TOMO AND CAD
8 series · 8 of 24 positions shown · non-contrast
Comparison: None.

CLINICAL DATA: Screening.

EXAM:
DIGITAL SCREENING BILATERAL MAMMOGRAM WITH TOMOSYNTHESIS AND CAD
TECHNIQUE: Bilateral screening digital craniocaudal and mediolateral oblique
mammograms were obtained. Bilateral screening digital breast
tomosynthesis was performed. The images were evaluated with
computer-aided detection.

[L MLO synth-2D]
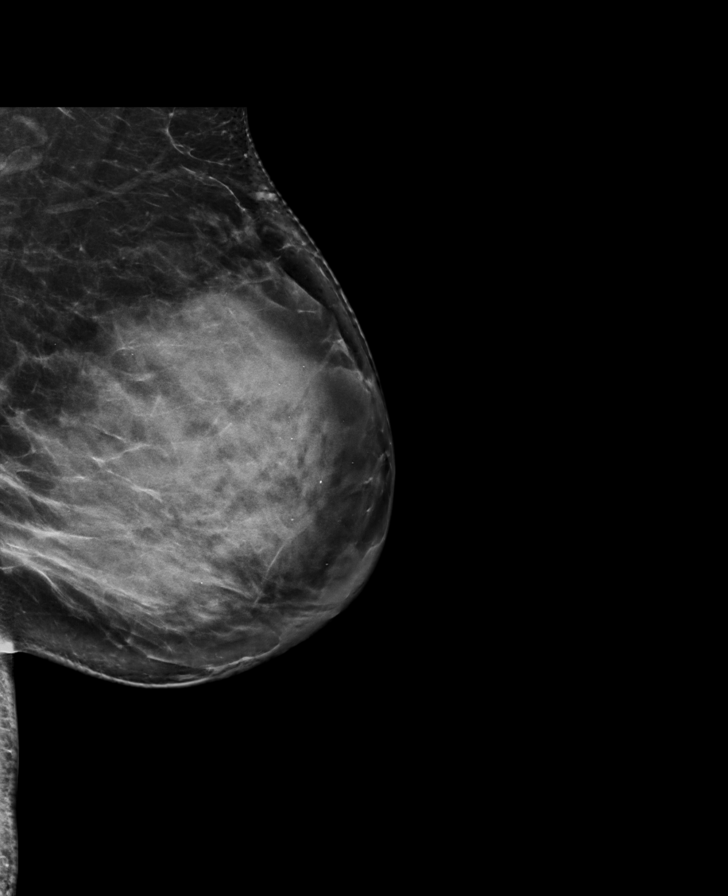

[L CC synth-2D]
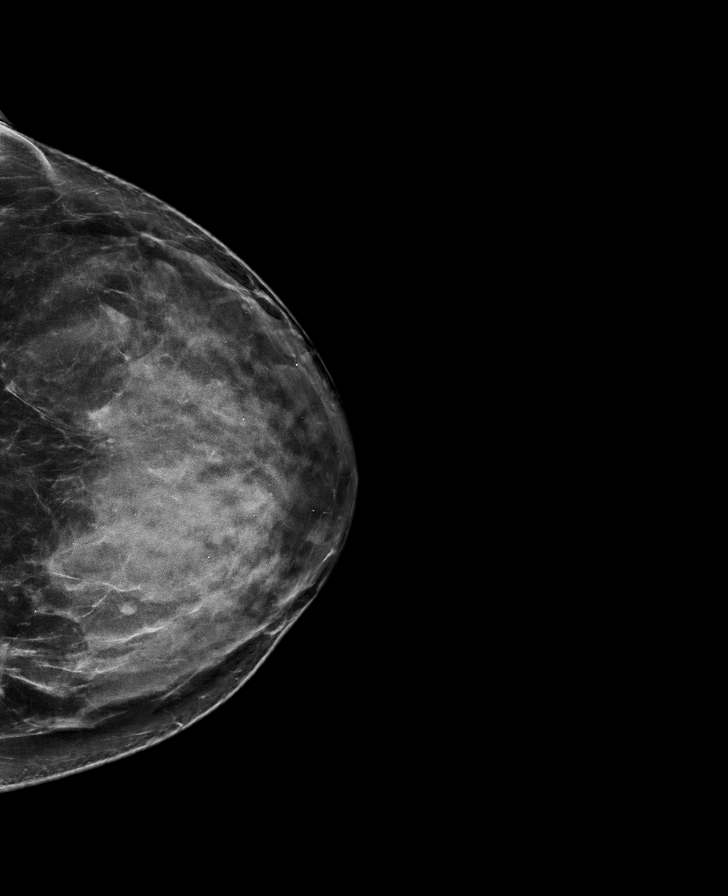

[R MLO synth-2D]
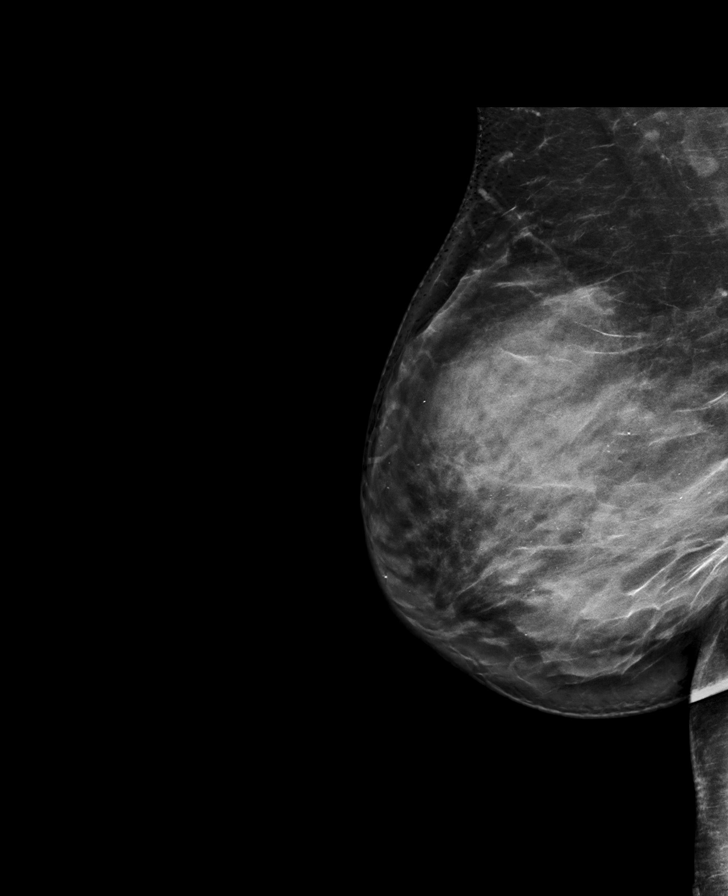

[R CC synth-2D]
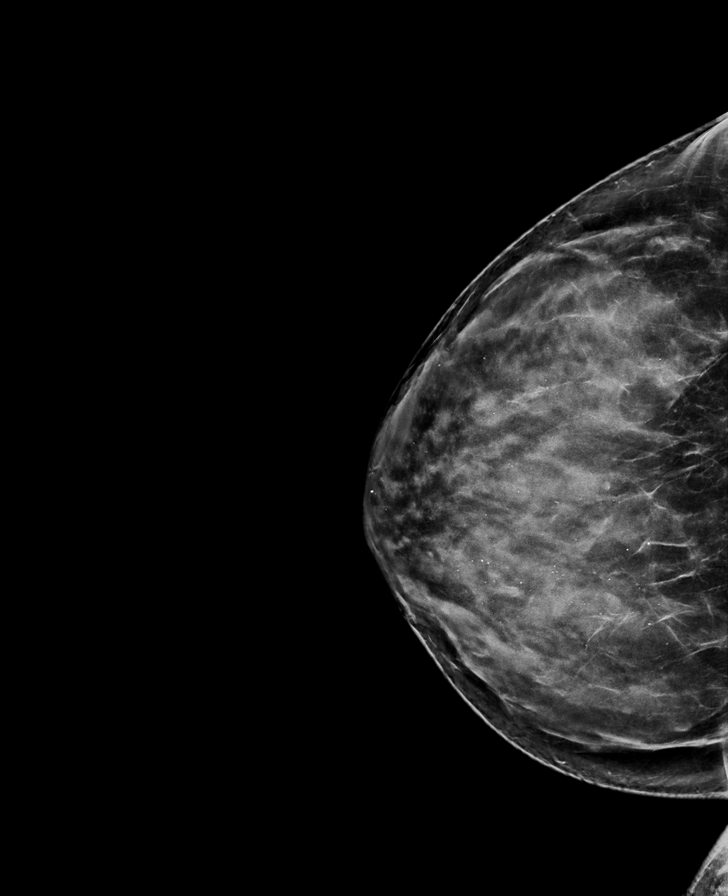

[R CC tomo · tomo slice 42/83.0]
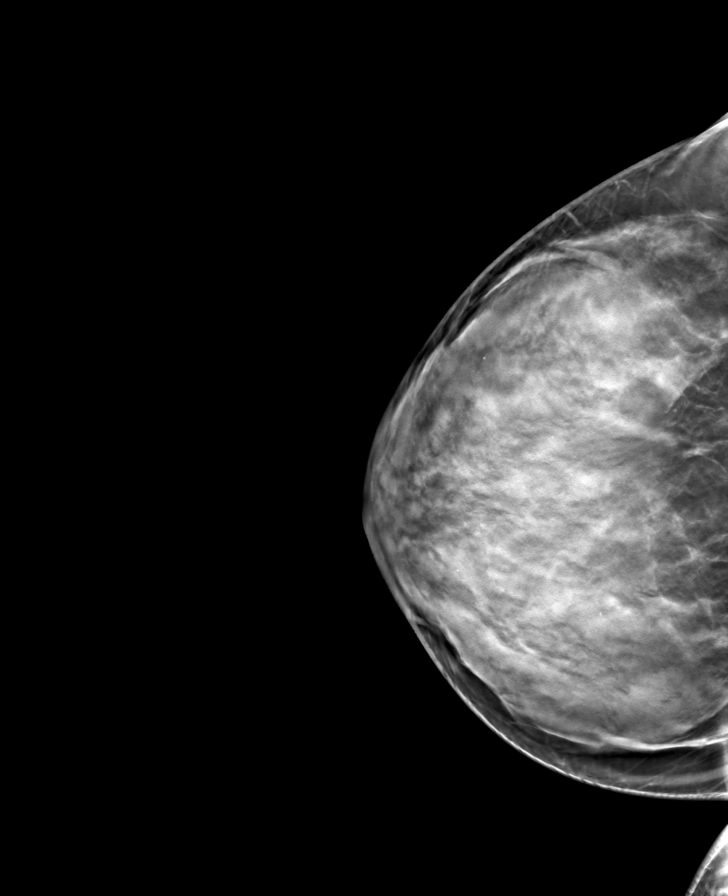

[L MLO tomo · tomo slice 47/92.0]
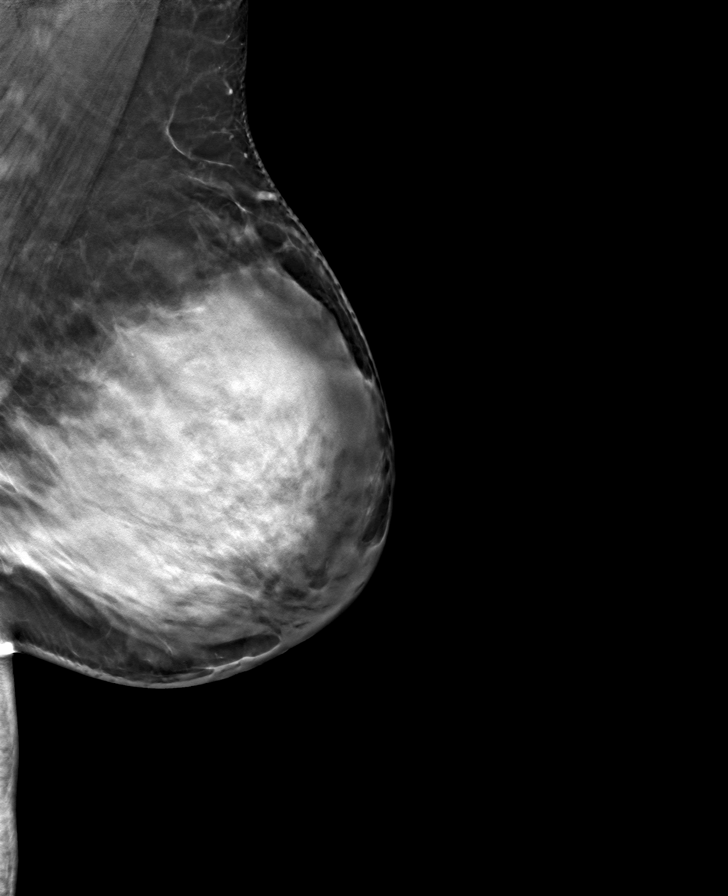

[R MLO tomo · tomo slice 49/96.0]
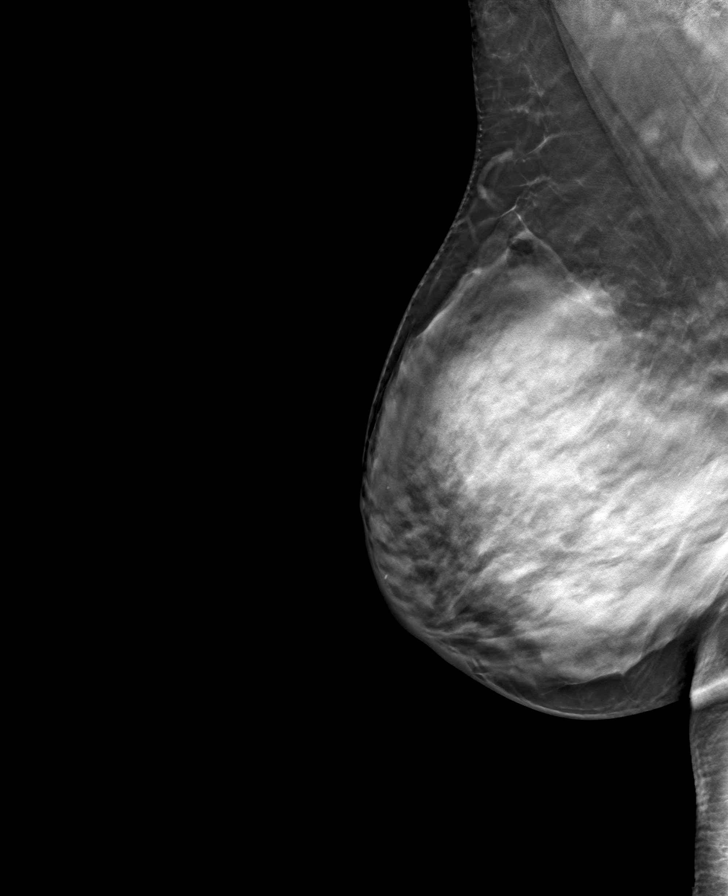

[L CC tomo · tomo slice 45/90.0]
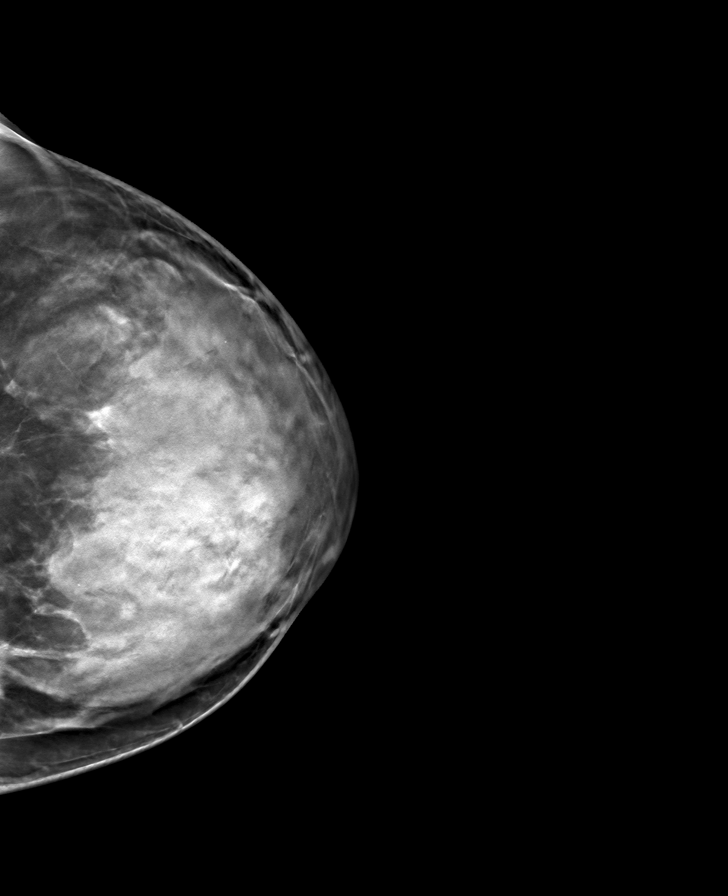

[8 of 24 positions shown; findings below may reference images not displayed]

ACR Breast Density Category d: The breast tissue is extremely dense,
which lowers the sensitivity of mammography.
FINDINGS: There are no findings suspicious for malignancy.
IMPRESSION: No mammographic evidence of malignancy. A result letter of this
screening mammogram will be mailed directly to the patient.

RECOMMENDATION:
Screening mammogram in one year. (Code:W3-Z-XIN)

BI-RADS CATEGORY  1: Negative.

## 2023-11-07 ENCOUNTER — Other Ambulatory Visit (HOSPITAL_COMMUNITY)
Admission: RE | Admit: 2023-11-07 | Discharge: 2023-11-07 | Disposition: A | Discharge status: Home / Self Care | Payer: 59 | Source: Ambulatory Visit | Attending: Obstetrics and Gynecology | Admitting: Obstetrics and Gynecology

## 2023-11-07 DIAGNOSIS — Z01419 Encounter for gynecological examination (general) (routine) without abnormal findings: Secondary | ICD-10-CM | POA: Insufficient documentation

## 2023-11-13 LAB — CYTOLOGY - PAP
Comment: NEGATIVE
Diagnosis: NEGATIVE
High risk HPV: NEGATIVE
